# Patient Record
Sex: Male | Born: 2000 | Race: White | Hispanic: No | Marital: Single | Smoking: Never smoker
Health system: Southern US, Community
[De-identification: ages and names within clinical notes are randomized; demographics above are authoritative.]

## PROBLEM LIST (undated history)

## (undated) DIAGNOSIS — J45909 Unspecified asthma, uncomplicated: Secondary | ICD-10-CM

## (undated) DIAGNOSIS — F909 Attention-deficit hyperactivity disorder, unspecified type: Secondary | ICD-10-CM

---
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
## 2021-04-05 ENCOUNTER — Emergency Department (HOSPITAL_COMMUNITY): Payer: Medicaid Other

## 2021-04-05 ENCOUNTER — Encounter (HOSPITAL_COMMUNITY): Payer: Self-pay

## 2021-04-05 ENCOUNTER — Emergency Department (HOSPITAL_COMMUNITY)
Admission: EM | Admit: 2021-04-05 | Discharge: 2021-04-05 | Disposition: A | Discharge status: Home / Self Care | Payer: Medicaid Other | Attending: Emergency Medicine | Admitting: Emergency Medicine

## 2021-04-05 DIAGNOSIS — J45909 Unspecified asthma, uncomplicated: Secondary | ICD-10-CM | POA: Insufficient documentation

## 2021-04-05 DIAGNOSIS — R0789 Other chest pain: Secondary | ICD-10-CM

## 2021-04-05 DIAGNOSIS — R059 Cough, unspecified: Secondary | ICD-10-CM | POA: Insufficient documentation

## 2021-04-05 LAB — CBC WITH DIFFERENTIAL/PLATELET
Abs Immature Granulocytes: 0.03 10*3/uL (ref 0.00–0.07)
Basophils Absolute: 0 10*3/uL (ref 0.0–0.1)
Basophils Relative: 0 %
Eosinophils Absolute: 0.3 10*3/uL (ref 0.0–0.5)
Eosinophils Relative: 4 %
HCT: 40.7 % (ref 39.0–52.0)
Hemoglobin: 13.5 g/dL (ref 13.0–17.0)
Immature Granulocytes: 0 %
Lymphocytes Relative: 25 %
Lymphs Abs: 2.3 10*3/uL (ref 0.7–4.0)
MCH: 28.5 pg (ref 26.0–34.0)
MCHC: 33.2 g/dL (ref 30.0–36.0)
MCV: 86 fL (ref 80.0–100.0)
Monocytes Absolute: 0.7 10*3/uL (ref 0.1–1.0)
Monocytes Relative: 8 %
Neutro Abs: 5.8 10*3/uL (ref 1.7–7.7)
Neutrophils Relative %: 63 %
Platelets: 194 10*3/uL (ref 150–400)
RBC: 4.73 MIL/uL (ref 4.22–5.81)
RDW: 12.8 % (ref 11.5–15.5)
WBC: 9.2 10*3/uL (ref 4.0–10.5)
nRBC: 0 % (ref 0.0–0.2)

## 2021-04-05 LAB — TROPONIN I (HIGH SENSITIVITY)
Troponin I (High Sensitivity): 3 ng/L (ref <18)
Troponin I (High Sensitivity): 3 ng/L (ref <18)

## 2021-04-05 LAB — BASIC METABOLIC PANEL
Anion gap: 9 (ref 5–15)
BUN: 17 mg/dL (ref 6–20)
CO2: 26 mmol/L (ref 22–32)
Calcium: 9.1 mg/dL (ref 8.9–10.3)
Chloride: 105 mmol/L (ref 98–111)
Creatinine, Ser: 0.97 mg/dL (ref 0.61–1.24)
GFR, Estimated: >60 mL/min (ref >60)
Glucose, Bld: 91 mg/dL (ref 70–99)
Potassium: 4.4 mmol/L (ref 3.5–5.1)
Sodium: 140 mmol/L (ref 135–145)

## 2021-04-05 NOTE — ED Provider Notes (Signed)
Emergency Medicine Provider Triage Evaluation Note  Zell Hylton , a 20 y.o. male  was evaluated in triage.  Pt complains of chest pain since yesterday.  Pain more so when coughing but chest feels tight all the time.  No fever/chills.  No sick contacts or covid exposures.  Not vaccinated for covid19.  Review of Systems  Positive: Cough, chest pain Negative: Fever  Physical Exam  BP 135/74 (BP Location: Left Arm)   Pulse 66   Temp 97.6 F (36.4 C) (Oral)   Resp 17   SpO2 98%  Gen:   Awake, no distress  Resp:  Normal effort, lungs CTAB MSK:   Moves extremities without difficulty  Medical Decision Making  Medically screening exam initiated at 4:15 AM.  Appropriate orders placed.  Brandon Wiechman was informed that the remainder of the evaluation will be completed by another provider, this initial triage assessment does not replace that evaluation, and the importance of remaining in the ED until their evaluation is complete.    Garlon Hatchet, PA-C 04/05/21 0416    Milagros Loll, MD 04/06/21 [phone]

## 2021-04-05 NOTE — ED Provider Notes (Signed)
[facility] EMERGENCY DEPARTMENT Provider Note   CSN: [id number] Arrival date & time: 04/05/21  0402     History Chief Complaint  Patient presents with   Chest Pain    Lee Mendez is a 20 y.o. male with a past medical history of asthma presenting with chest pain starting yesterday morning. States he had a cough with yellow sputum with associated mid sternal upper chest pain. He describes it as a sharp tight pain that last about an hour at a time. Currently feels that chest pain is somewhat improved. Chest pain worse with cough and with inspiration. Denies fever, chills, shortness of breath, wheezing, abdominal pain, n/v/d. Does not feel symptoms are his typical asthma symptoms. Uses albuterol as needed and maintenance  inhaler but has not used maintenance  inhaler in weeks. Las time he used albuterol was about 2 weeks ago. Denies     Past Medical History:  Diagnosis Date   ADHD (attention deficit hyperactivity disorder)    Asthma     There are no problems to display for this patient.   Past Surgical History:  Procedure Laterality Date   adenoids removed  2005   TYMPANOSTOMY TUBE PLACEMENT  2005      History reviewed. No pertinent family history.  Social History   Tobacco Use   Smoking status: Never   Smokeless tobacco: Never  Substance Use Topics   Alcohol use: No   Drug use: No    Home Medications Prior to Admission medications   Medication Sig Start Date End Date Taking? Authorizing Provider  ibuprofen (ADVIL,MOTRIN) 600 MG tablet Take 1 tab PO Q6h x 1-2 days then Q6h prn pain 11/20/15   Lowanda Foster, NP    Allergies    Amoxicillin and Other  Review of Systems   Review of Systems  Constitutional:  Negative for activity change, chills and fever.  HENT:  Negative for congestion, rhinorrhea and sore throat.   Eyes:  Negative for pain and discharge.  Respiratory:  Positive for cough and chest tightness. Negative for shortness of  breath and wheezing.   Cardiovascular:  Positive for chest pain. Negative for palpitations.  Gastrointestinal:  Negative for abdominal pain, diarrhea, nausea and vomiting.  Genitourinary:  Negative for dysuria and frequency.  Musculoskeletal:  Negative for back pain and joint swelling.  Skin:  Negative for rash and wound.  Neurological:  Negative for dizziness, syncope, weakness, light-headedness and numbness.  Psychiatric/Behavioral:  Negative for agitation and confusion.    Physical Exam Updated Vital Signs BP (!) 106/54   Pulse 60   Temp 98.3 F (36.8 C)   Resp 18   SpO2 100%   Physical Exam Constitutional:      General: He is not in acute distress.    Appearance: He is well-developed.  HENT:     Head: Normocephalic and atraumatic.  Eyes:     Extraocular Movements: Extraocular movements intact.     Pupils: Pupils are equal, round, and reactive to light.  Cardiovascular:     Rate and Rhythm: Normal rate and regular rhythm.     Pulses:          Radial pulses are 2+ on the right side and 2+ on the left side.     Heart sounds: Normal heart sounds. No murmur heard.   No friction rub. No gallop.  Pulmonary:     Effort: Pulmonary effort is normal. No tachypnea or respiratory distress.     Breath sounds: Normal breath  sounds. No wheezing, rhonchi or rales.  Musculoskeletal:        General: Normal range of motion.     Cervical back: Normal range of motion and neck supple.     Right lower leg: No tenderness. No edema.     Left lower leg: No tenderness. No edema.  Skin:    General: Skin is warm and dry.  Neurological:     General: No focal deficit present.     Mental Status: He is alert and oriented to person, place, and time.  Psychiatric:        Mood and Affect: Mood normal.        Behavior: Behavior normal.    ED Results / Procedures / Treatments   Labs (all labs ordered are listed, but only abnormal results are displayed) Labs Reviewed  CBC WITH  DIFFERENTIAL/PLATELET  BASIC METABOLIC PANEL  TROPONIN I (HIGH SENSITIVITY)  TROPONIN I (HIGH SENSITIVITY)    EKG EKG Interpretation  Date/Time:  Wednesday April 05 2021 04:12:14 EDT Ventricular Rate:  66 PR Interval:  120 QRS Duration: 92 QT Interval:  394 QTC Calculation: 413 R Axis:   73 Text Interpretation: Normal sinus rhythm Artifact Otherwise within normal limits Confirmed by Gerhard Munch [id number]) on 04/05/2021 8:30:24 AM  Radiology DG Chest 2 View  Result Date: 04/05/2021 CLINICAL DATA:  Chest pain, cough EXAM: CHEST - 2 VIEW COMPARISON:  None. FINDINGS: No consolidation, features of edema, pneumothorax, or effusion. Pulmonary vascularity is normally distributed. The cardiomediastinal contours are unremarkable. No acute osseous or soft tissue abnormality. IMPRESSION: No acute cardiopulmonary abnormality. Electronically Signed   By: Kreg Shropshire M.D.   On: 04/05/2021 04:47     Medications Ordered in ED Medications - No data to display  ED Course  I have reviewed the triage vital signs and the nursing notes.  Pertinent labs & imaging results that were available during my care of the patient were reviewed by me and considered in my medical decision making (see chart for details).    MDM Rules/Calculators/A&P                         Lee Mendez is a 20 year old who presents for reproducible chest pain and cough. HD stable. No acute ischemic EKG changes. Troponin negative x2. Lab work unremarkable. Low suspicion for cardiac etiology his chest pain. CXR unremarkable, no wheezing on exam, no increased work of breathing, saturating well on room air. Etiology of pain could be musculoskeletal in nature given reproducibility and worsening of cough. Stable for discharge home with ibuprofen. Return precautions discussed with patient.   Final Clinical Impression(s) / ED Diagnoses Final diagnoses:  Atypical chest pain    Rx / DC Orders ED Discharge Orders     None         Quincy Simmonds, MD 04/05/21 1116    Gerhard Munch, MD 04/06/21 1621

## 2021-04-05 NOTE — Discharge Instructions (Addendum)
You were evaluated for chest pain and cough. Your lab work was normal and EKG did not show evidence of cardiac cause of your chest pain. This may be musculoskeletal from cough. You can take ibuprofen 400 mg every 6 hours as needed for the next 3 days. If pain is worsening or not improving, or you develop dizziness, light-headedness, or feeling like passing out please return to the ED.

## 2021-04-05 NOTE — ED Triage Notes (Signed)
Pt states that he has chest tightness and cough that has been going on for the past few days.

## 2022-04-28 IMAGING — CR DG CHEST 2V
2 series · 2 of 2 positions shown · non-contrast
Comparison: None.

CLINICAL DATA: Chest pain, cough

EXAM:
CHEST - 2 VIEW

[chest pa]
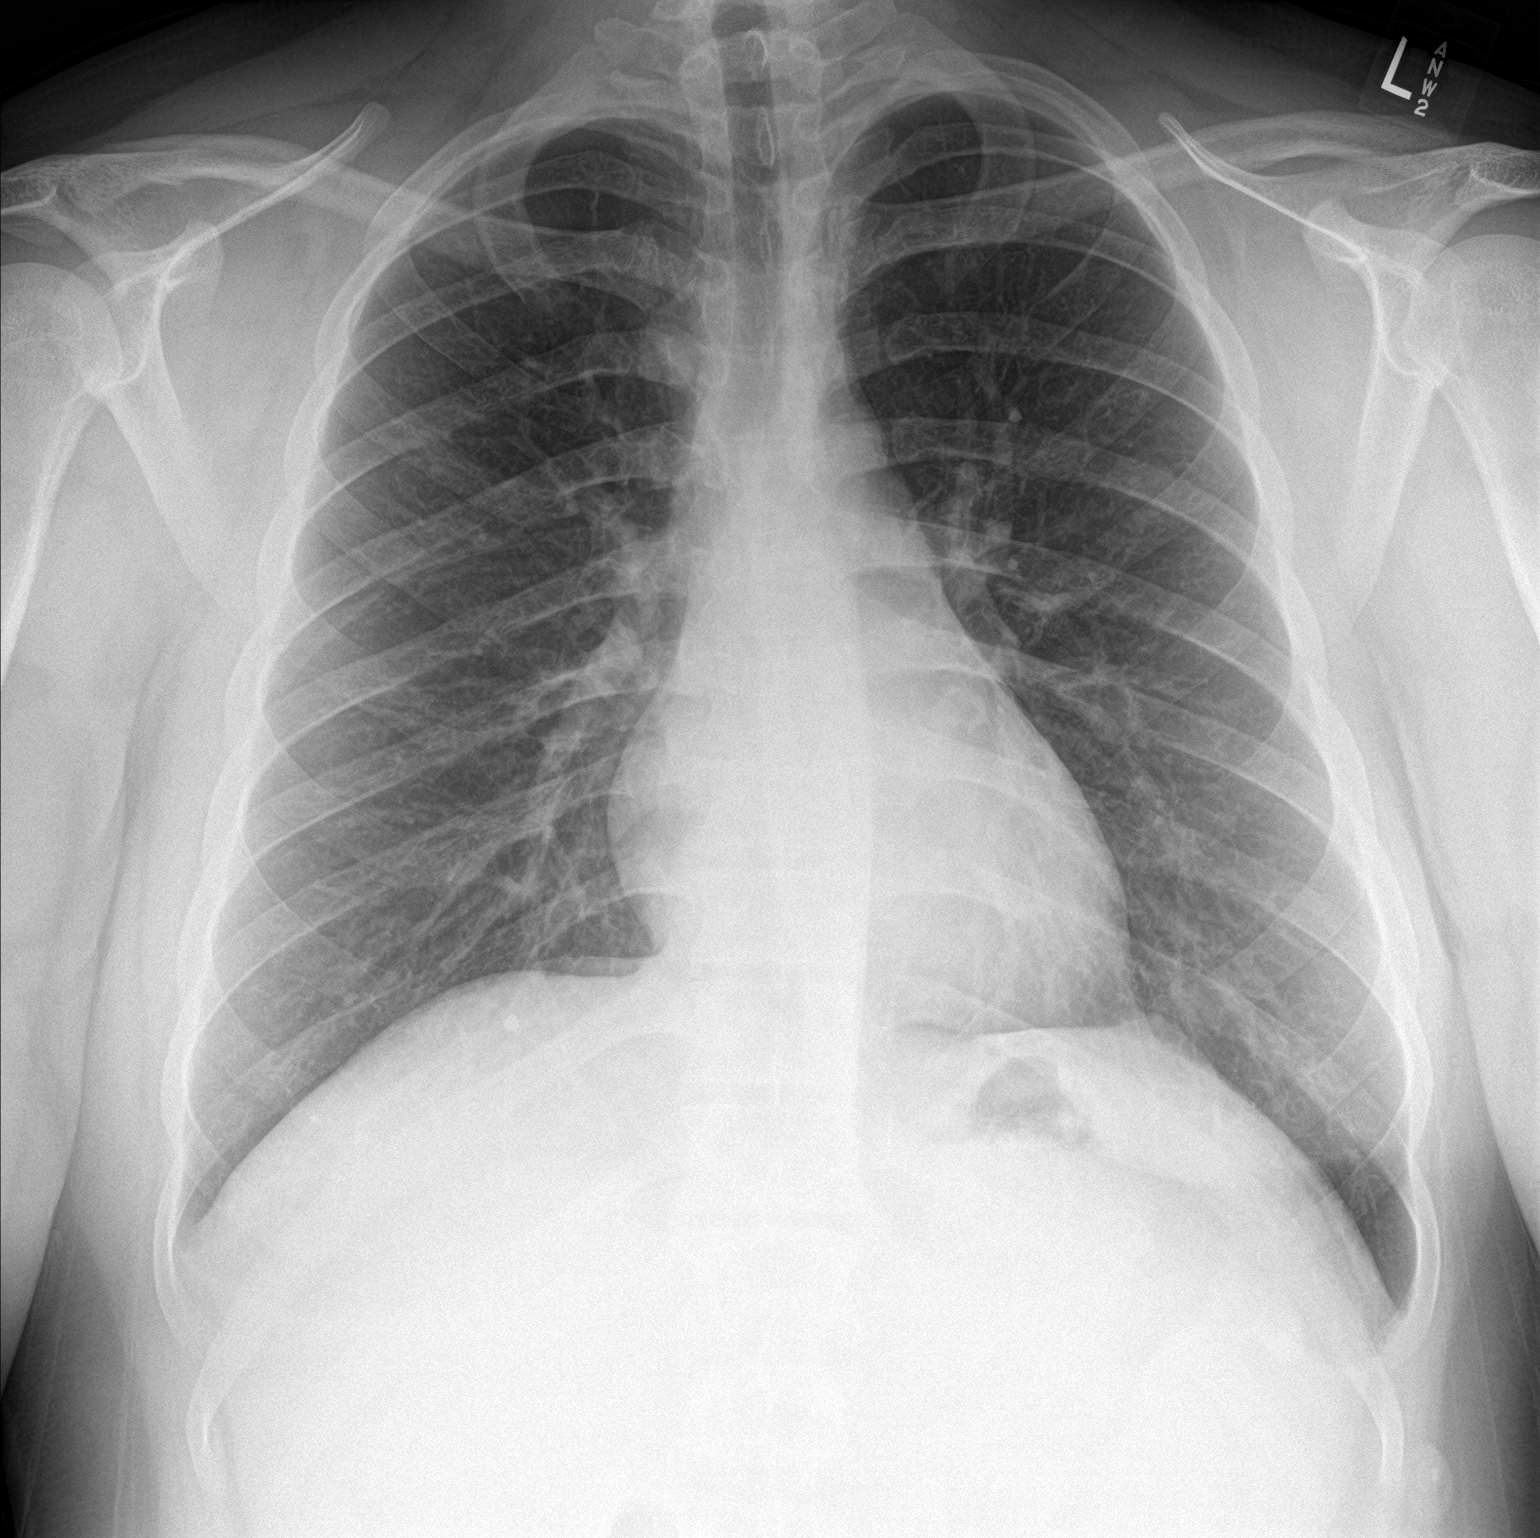

[chest lat]
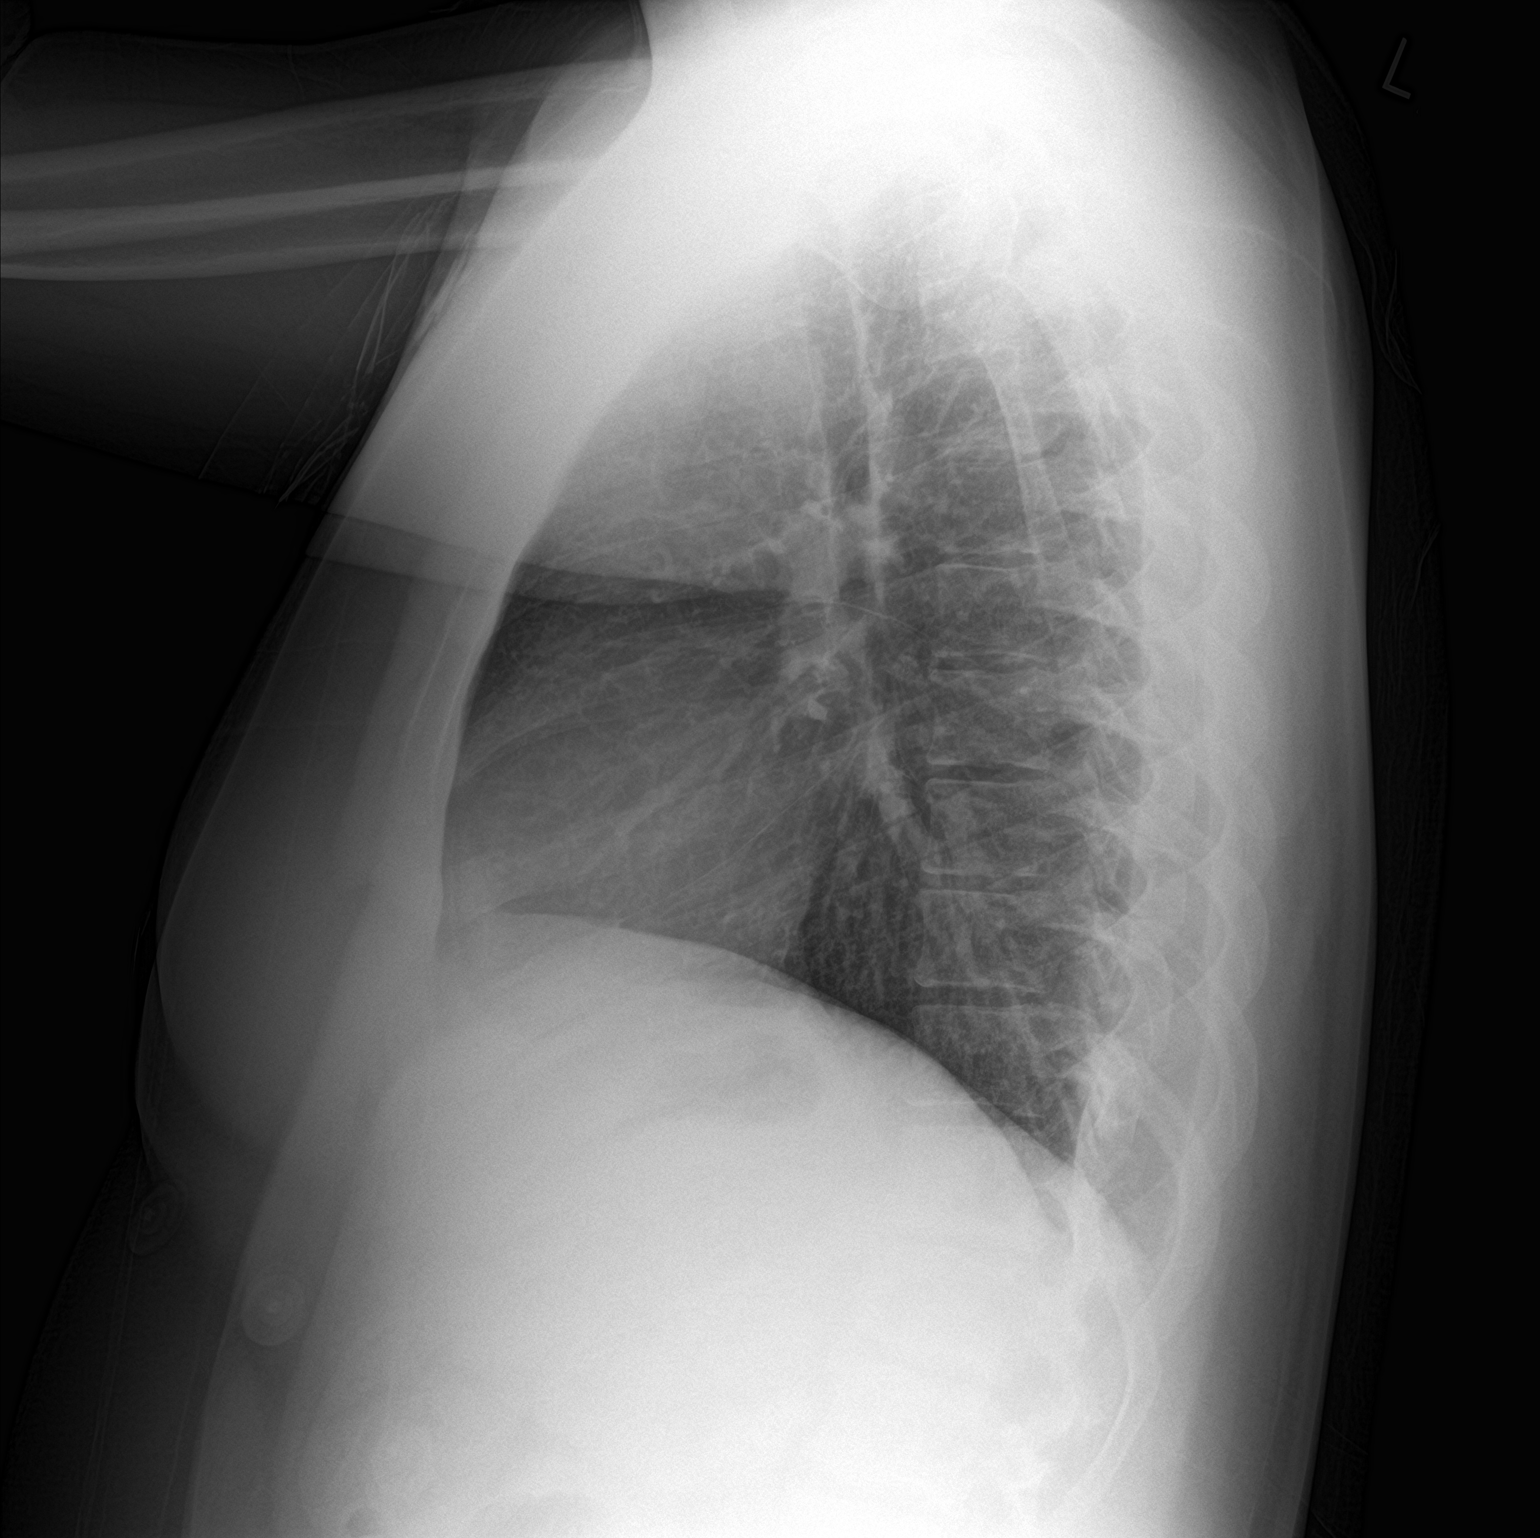

[2 of 2 positions shown; findings below may reference images not displayed]

FINDINGS: No consolidation, features of edema, pneumothorax, or effusion.
Pulmonary vascularity is normally distributed. The cardiomediastinal
contours are unremarkable. No acute osseous or soft tissue
abnormality.
IMPRESSION: No acute cardiopulmonary abnormality.

## 2022-06-15 ENCOUNTER — Ambulatory Visit: Payer: Medicaid Other | Admitting: Internal Medicine

## 2022-07-11 NOTE — Progress Notes (Unsigned)
NEW PATIENT Date of Service/Encounter:  07/12/22 Referring provider: Hayden Pedro, MD Primary care provider: Hayden Pedro, MD  Subjective:  Lee Mendez is a 21 y.o. male with a PMHx of intermittent asthma presenting today for evaluation of asthma. History obtained from: chart review and patient and fiancee.   Asthma: He has been diagnosed with asthma since he was a young infant. He has been on controller inhaler (flovent)-discontinued about 2-3 years ago. He uses rescue inhaler about once every 2 to 3 months. HE usually goes to ER about 3 to 4 times per year for asthma, has only been once this year.  Has been on OCS once this year. Never hospitalized. Has had bronchitis multiple times per year, last was end of last year. He has had Covid in Feb 2023.  His breathing worsened following this. He did not get Covid vaccines and is not interested in getting them. He does have his childhood vaccines. Hasn't had flu shot yearly since 2019. No personal smoking history, fiancee vapes.  Allergic rhinitis:  Previous patient on AIT from the ages of 51 to 68 yo at our practice.  He will get occasional congestion and sneezing.  He has been on allergy meds in the past, but doesn't take anything anymore. He feels like he needs to take something, but is unlikely to take something daily.  He doesn't know if the injections were helpful. He knows he is allergic to dogs, grass, dust mites.  He has dogs which don't bother him.  ED visit in March 2023 for flare, chest x-ray read as normal.  Treated with prednisone. Most recent AEC 300 from 2022  Amoxicillin allergy-had a rash, last episode at age 12 yo.   Other allergy screening: Food allergy: no Hymenoptera allergy: no Eczema:no  Past Medical History: Past Medical History:  Diagnosis Date   ADHD (attention deficit hyperactivity disorder)    Asthma    Medication List:  Current Outpatient Medications  Medication Sig Dispense  Refill   albuterol (VENTOLIN HFA) 108 (90 Base) MCG/ACT inhaler Inhale 2 puffs into the lungs every 6 (six) hours as needed for wheezing or shortness of breath. 8 g 2   azelastine (ASTELIN) 0.1 % nasal spray Place 2 sprays into both nostrils 2 (two) times daily as needed for rhinitis. Use in each nostril as directed 30 mL 3   cetirizine (ZYRTEC ALLERGY) 10 MG tablet Take 1 tablet (10 mg total) by mouth daily. 30 tablet 3   fluticasone (FLOVENT HFA) 110 MCG/ACT inhaler Inhale 2 puffs into the lungs 2 (two) times daily. 1 each 3   ibuprofen (ADVIL,MOTRIN) 600 MG tablet Take 1 tab PO Q6h x 1-2 days then Q6h prn pain 30 tablet 0   No current facility-administered medications for this visit.   Known Allergies:  Allergies  Allergen Reactions   Amoxicillin Hives   Imipramine Rash   Penicillins Hives, Rash and Itching   Other     Seasonal   Past Surgical History: Past Surgical History:  Procedure Laterality Date   adenoids removed  2005   TYMPANOSTOMY TUBE PLACEMENT  2005   Family History: History reviewed. No pertinent family history. Social History: Lee Mendez lives in a house built 69 years ago, no water damage, hardwood floors, gas heating, central AC, 7 dogs, no cockroaches, using dust mite protection on the bedding and pillows, he is fianc vapes.  He works as a Primary school teacher.  He has no HEPA filter in his home.  His [place] not [place] an interstate/industrial area.  He is exposed to fumes chemicals and dust at his work and home.   ROS:  All other systems negative except as noted per HPI.  Objective:  Blood pressure 106/66, pulse 85, temperature 97.9 F (36.6 C), temperature source Temporal, resp. rate 16, height 5\' 10"  (1.778 m), weight 236 lb 8 oz (107.3 kg), SpO2 98 %. Body mass index is 33.93 kg/m. Physical Exam:  General Appearance:  Alert, cooperative, no distress, appears stated age  Head:  Normocephalic, without obvious abnormality, atraumatic  Eyes:   Conjunctiva clear, EOM's intact  Nose: Nares normal, hypertrophic turbinates, normal mucosa, no visible anterior polyps, and septum midline  Throat: Lips, tongue normal; teeth and gums normal, normal posterior oropharynx  Neck: Supple, symmetrical  Lungs:   clear to auscultation bilaterally, Respirations unlabored, no coughing  Heart:  regular rate and rhythm and no murmur, Appears well perfused  Extremities: No edema  Skin: Skin color, texture, turgor normal, no rashes or lesions on visualized portions of skin  Neurologic: No gross deficits    Diagnostics: Spirometry:  Tracings reviewed. His effort: Good reproducible efforts. FVC: 3.46L (pre), 3.84L  (post), +11%, 380 L FEV1: 3.35L, 72% predicted (pre), 3.59L, 77% predicted (post), +7%, 240 L FEV1/FVC ratio: 114% (pre), 109% (post) Interpretation: Spirometry consistent with possible restrictive disease with bronchodilator response  Assessment and Plan  Mild persistent Asthma, not well-controlled: Diagnosed in childhood.  Had been off controller inhaler for several years.  COVID-19 infection February 2023 and breathing worsened. Not vaccinated against COVID-19 and not interested in vaccination.1 ED visit + OCS this year, previous years 2-4 times ED visits per year. His fiance vapes in home. - your lung testing today showed improvement with albuterol use = bronchial hyperresponsiveness - Controller Inhaler: Start Flovent 110 mcg 2 puffs twice a day; This Should Be Used Everyday - Rinse mouth out after use - Rescue Inhaler: Albuterol (Proair/Ventolin) 2 puffs or 1 vial via nebulizer . Use  every 4-6 hours as needed for chest tightness, wheezing, or coughing.  Can also use 15 minutes prior to exercise if you have symptoms with activity. - Asthma is not controlled if:  - Symptoms are occurring >2 times a week OR  - >2 times a month nighttime awakenings  - You are requiring systemic steroids (prednisone/steroid injections) more than once per  year  - Your require hospitalization for your asthma.  - Please call the clinic to schedule a follow up if these symptoms arise Vaccine counseling provided. 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  Currently off all medications.  Reports positive testing to dog and has 7 dogs in his home.  Does not feel that they worsen his symptoms. -If symptoms not controlled with the below, consider retesting and restarting allergy injections. Not interested in testing today. - Start Astelin (Azelastine) 1-2 sprays in each nostril twice a day as needed.  You may use this as needed for nasal congestion/itchy ears/itchy nose if desired - Start over the counter antihistamine daily or daily as needed.   -Your options include Zyrtec (Cetirizine) 10mg , Claritin (Loratadine) 10mg , Allegra (Fexofenadine) 180mg , or Xyzal (Levocetirinze) 5mg   H/O Penicillin allergy - low risk History of rash following penicillin use.  Last given at age 89 years old - please schedule follow-up appt at your convenience for penicillin testing followed by graded oral challenge if indicated - please  refrain from taking any antihistamines at least 3 days prior to this appointment  - around 80% of individuals outgrow this allergy in ~ 10 years and carrying it as a diagnosis can prevent you from getting proper therapy if needed   Follow-up in 2 to 3 months, sooner if needed It was wonderful meeting you today! Thank you for letting me participate in your care. Tonny Bollman, MD [facility]    This note in its entirety was forwarded to the Provider who requested this consultation.  Thank you for your kind referral. I appreciate the opportunity to take part in Lee Mendez's care. Please do not hesitate to contact me with questions.  Sincerely,  Tonny Bollman, MD [facility]

## 2022-07-12 ENCOUNTER — Ambulatory Visit (INDEPENDENT_AMBULATORY_CARE_PROVIDER_SITE_OTHER): Payer: Medicaid Other | Admitting: Internal Medicine

## 2022-07-12 ENCOUNTER — Encounter: Payer: Self-pay | Admitting: Internal Medicine

## 2022-07-12 VITALS — BP 106/66 | HR 85 | Temp 97.9°F | Resp 16 | Ht 70.0 in | Wt 236.5 lb

## 2022-07-12 DIAGNOSIS — J4531 Mild persistent asthma with (acute) exacerbation: Secondary | ICD-10-CM

## 2022-07-12 DIAGNOSIS — Z880 Allergy status to penicillin: Secondary | ICD-10-CM | POA: Diagnosis not present

## 2022-07-12 DIAGNOSIS — J45909 Unspecified asthma, uncomplicated: Secondary | ICD-10-CM | POA: Insufficient documentation

## 2022-07-12 DIAGNOSIS — J3081 Allergic rhinitis due to animal (cat) (dog) hair and dander: Secondary | ICD-10-CM | POA: Diagnosis not present

## 2022-07-12 DIAGNOSIS — Z7185 Encounter for immunization safety counseling: Secondary | ICD-10-CM

## 2022-07-12 MED ORDER — CETIRIZINE HCL 10 MG PO TABS: 10.0000 mg | ORAL_TABLET | Freq: Every day | ORAL | 3 refills | Status: AC

## 2022-07-12 MED ORDER — ALBUTEROL SULFATE HFA 108 (90 BASE) MCG/ACT IN AERS: 2.0000 | INHALATION_SPRAY | Freq: Four times a day (QID) | RESPIRATORY_TRACT | 2 refills | Status: AC | PRN

## 2022-07-12 MED ORDER — FLUTICASONE PROPIONATE HFA 110 MCG/ACT IN AERO: 2.0000 | INHALATION_SPRAY | Freq: Two times a day (BID) | RESPIRATORY_TRACT | 3 refills | Status: AC

## 2022-07-12 MED ORDER — AZELASTINE HCL 0.1 % NA SOLN: 2.0000 | Freq: Two times a day (BID) | NASAL | 3 refills | Status: AC | PRN

## 2022-07-12 MED ORDER — ALBUTEROL SULFATE (2.5 MG/3ML) 0.083% IN NEBU: 2.5000 mg | INHALATION_SOLUTION | Freq: Four times a day (QID) | RESPIRATORY_TRACT | 1 refills | Status: AC | PRN

## 2022-07-12 NOTE — Patient Instructions (Addendum)
Mild persistent Asthma, not well-controlled: - your lung testing today showed improvement with albuterol use - Controller Inhaler: Start Flovent 110 mcg 2 puffs twice a day; This Should Be Used Everyday - Rinse mouth out after use - Rescue Inhaler: Albuterol (Proair/Ventolin) 2 puffs or 1 vial via nebulizer. Use  every 4-6 hours as needed for chest tightness, wheezing, or coughing.  Can also use 15 minutes prior to exercise if you have symptoms with activity. - Asthma is not controlled if:  - Symptoms are occurring >2 times a week OR  - >2 times a month nighttime awakenings  - You are requiring systemic steroids (prednisone/steroid injections) more than once per year  - Your require hospitalization for your asthma.  - Please call the clinic to schedule a follow up if these symptoms arise  Chroinc Rhinitis -allergic: -If symptoms not controlled with the below, consider retesting and restarting allergy injections - Start Astelin (Azelastine) 1-2 sprays in each nostril twice a day as needed.  You may use this as needed for nasal congestion/itchy ears/itchy nose if desired - Start over the counter antihistamine daily or daily as needed.   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 Thank you for letting me participate in your care. Sigurd Sos, MD [facility]

## 2022-07-13 NOTE — Addendum Note (Signed)
Addended by: Felipa Emory on: 07/13/2022 10:48 AM   Modules accepted: Orders

## 2022-10-09 ENCOUNTER — Ambulatory Visit: Payer: Self-pay | Admitting: Internal Medicine
